# Patient Record
Sex: Female | Born: 1995 | Race: White | Hispanic: No | Marital: Single | Smoking: Never smoker
Health system: Southern US, Community
[De-identification: ages and names within clinical notes are randomized; demographics above are authoritative.]

---
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
## 2018-12-02 ENCOUNTER — Other Ambulatory Visit: Payer: Self-pay

## 2018-12-02 ENCOUNTER — Encounter (HOSPITAL_BASED_OUTPATIENT_CLINIC_OR_DEPARTMENT_OTHER): Payer: Self-pay | Admitting: *Deleted

## 2018-12-02 ENCOUNTER — Emergency Department (HOSPITAL_BASED_OUTPATIENT_CLINIC_OR_DEPARTMENT_OTHER)
Admission: EM | Admit: 2018-12-02 | Discharge: 2018-12-02 | Disposition: A | Discharge status: Home / Self Care | Payer: BC Managed Care – PPO | Attending: Medical | Admitting: Medical

## 2018-12-02 DIAGNOSIS — Z23 Encounter for immunization: Secondary | ICD-10-CM | POA: Diagnosis not present

## 2018-12-02 DIAGNOSIS — Z203 Contact with and (suspected) exposure to rabies: Secondary | ICD-10-CM | POA: Diagnosis not present

## 2018-12-02 DIAGNOSIS — Z2914 Encounter for prophylactic rabies immune globin: Secondary | ICD-10-CM | POA: Insufficient documentation

## 2018-12-02 MED ORDER — RABIES VACCINE, PCEC IM SUSR
1.0000 mL | Freq: Once | INTRAMUSCULAR | Status: AC
  Administered 2018-12-02: 1 mL via INTRAMUSCULAR
  Filled 2018-12-02: qty 1

## 2018-12-02 MED ORDER — RABIES IMMUNE GLOBULIN 150 UNIT/ML IM INJ
20.0000 [IU]/kg | INJECTION | Freq: Once | INTRAMUSCULAR | Status: AC
  Administered 2018-12-02: 23:00:00 1125 [IU] via INTRAMUSCULAR
  Filled 2018-12-02: qty 8

## 2018-12-02 NOTE — Discharge Instructions (Signed)
Please use soap and water to the affected area.  Follow-up with [facility] at [facility] for the rest of your course of rabies immunizations.

## 2018-12-02 NOTE — ED Notes (Signed)
Pt. Unsure if she has been in contact with a bat or not.

## 2018-12-02 NOTE — ED Triage Notes (Signed)
Reports that a rabid bat was in their house over the weekend. States that she noticed a 'bite' on her left hand today. States that animal control called today and let her know it was positive for rabies.

## 2018-12-02 NOTE — ED Provider Notes (Signed)
[facility] EMERGENCY DEPARTMENT Provider Note   CSN: [id number] Arrival date & time: 12/02/18  2107     History   Chief Complaint Chief Complaint  Patient presents with  . Animal Bite    HPI Alexis Villanueva is a 23 y.o. female.  23 year old female here with concern for rabies exposure.  She lives in a house where her a bat was caught and later identified as positive for rabies.  She has a possible wound on her left hand and no known trauma so there is concerned that she may been bitten by the bat.  No fevers or chills.  No wound drainage or redness.     The history is provided by the patient.  Animal Bite Contact animal:  Bat Location:  Hand Hand injury location:  Dorsum of L hand Pain details:    Severity:  No pain   Progression:  Unchanged Incident location:  Home Notifications:  Animal control Relieved by:  None tried Worsened by:  Nothing Ineffective treatments:  None tried Associated symptoms: no fever, no numbness, no rash and no swelling     History reviewed. No pertinent past medical history.  There are no active problems to display for this patient.   History reviewed. No pertinent surgical history.   OB History   No obstetric history on file.      Home Medications    Prior to Admission medications   Medication Sig Start Date End Date Taking? Authorizing Provider  naproxen sodium (ANAPROX) 220 MG tablet Take 220 mg by mouth 2 (two) times daily as needed. For pain    [provider]    Family History History reviewed. No pertinent family history.  Social History Social History   Tobacco Use  . Smoking status: Never Smoker  . Smokeless tobacco: Never Used  Substance Use Topics  . Alcohol use: Never    Frequency: Never  . Drug use: Never     Allergies   Patient has no known allergies.   Review of Systems Review of Systems  Constitutional: Negative for fever.  Skin: Positive for wound (??). Negative for rash.   Neurological: Negative for numbness.     Physical Exam Updated Vital Signs BP (!) 135/91 (BP Location: Left Arm)   Pulse 100   Temp 99.4 F (37.4 C) (Oral)   Resp 18   Ht 5\' 4"  (1.626 m)   Wt 56.7 kg   LMP 11/18/2018   SpO2 100%   BMI 21.46 kg/m   Physical Exam Constitutional:      Appearance: She is well-developed.  HENT:     Head: Normocephalic and atraumatic.  Eyes:     Conjunctiva/sclera: Conjunctivae normal.  Neck:     Musculoskeletal: Neck supple.  Musculoskeletal:     Comments: She has a little discoloration over the dorsum of her left hand.  No obvious puncture wounds noted.  No overlying erythema.  Skin:    General: Skin is warm and dry.     Capillary Refill: Capillary refill takes less than 2 seconds.  Neurological:     General: No focal deficit present.     Mental Status: She is alert.     GCS: GCS eye subscore is 4. GCS verbal subscore is 5. GCS motor subscore is 6.      ED Treatments / Results  Labs (all labs ordered are listed, but only abnormal results are displayed) Labs Reviewed - No data to display  EKG None  Radiology No  results found.  Procedures Procedures (including critical care time)  Medications Ordered in ED Medications  rabies immune globulin (HYPERAB/KEDRAB) injection 1,125 Units (1,125 Units Intramuscular Given 12/02/18 2301)  rabies vaccine (RABAVERT) injection 1 mL (1 mL Intramuscular Given 12/02/18 2259)     Initial Impression / Assessment and Plan / ED Course  I have reviewed the triage vital signs and the nursing notes.  Pertinent labs & imaging results that were available during my care of the patient were reviewed by me and considered in my medical decision making (see chart for details).  Clinical Course as of Dec 03 927  Tue Nov 17, 564  3073 23 year old female here for possible exposure to a proven rapid bat  Unclear if she had any real exposure but the downside of missing it is great.  She is very anxious  about shots and is having difficulty making decision whether she wants immunization /prophylaxis.   [MB]    Clinical Course User Index [MB] Hayden Rasmussen, MD        Final Clinical Impressions(s) / ED Diagnoses   Final diagnoses:  Need for rabies vaccination    ED Discharge Orders    None       Hayden Rasmussen, MD 12/03/18 [phone]

## 2018-12-05 ENCOUNTER — Ambulatory Visit (HOSPITAL_COMMUNITY)
Admission: EM | Admit: 2018-12-05 | Discharge: 2018-12-05 | Disposition: A | Discharge status: Home / Self Care | Payer: BC Managed Care – PPO | Attending: Internal Medicine | Admitting: Internal Medicine

## 2018-12-05 ENCOUNTER — Other Ambulatory Visit: Payer: Self-pay

## 2018-12-05 DIAGNOSIS — Z203 Contact with and (suspected) exposure to rabies: Secondary | ICD-10-CM | POA: Diagnosis not present

## 2018-12-05 DIAGNOSIS — Z23 Encounter for immunization: Secondary | ICD-10-CM | POA: Diagnosis not present

## 2018-12-05 MED ORDER — RABIES VACCINE, PCEC IM SUSR
INTRAMUSCULAR | Status: AC
  Filled 2018-12-05: qty 1

## 2018-12-05 MED ORDER — RABIES VACCINE, PCEC IM SUSR
1.0000 mL | Freq: Once | INTRAMUSCULAR | Status: AC
  Administered 2018-12-05: 19:00:00 1 mL via INTRAMUSCULAR

## 2018-12-05 NOTE — ED Triage Notes (Signed)
Pt presents for second rabies injection  

## 2018-12-09 ENCOUNTER — Ambulatory Visit (HOSPITAL_COMMUNITY)
Admission: EM | Admit: 2018-12-09 | Discharge: 2018-12-09 | Disposition: A | Discharge status: Home / Self Care | Payer: BC Managed Care – PPO | Attending: Family Medicine | Admitting: Family Medicine

## 2018-12-09 ENCOUNTER — Other Ambulatory Visit: Payer: Self-pay

## 2018-12-09 DIAGNOSIS — Z203 Contact with and (suspected) exposure to rabies: Secondary | ICD-10-CM | POA: Diagnosis not present

## 2018-12-09 DIAGNOSIS — Z23 Encounter for immunization: Secondary | ICD-10-CM | POA: Diagnosis not present

## 2018-12-09 MED ORDER — RABIES VACCINE, PCEC IM SUSR
1.0000 mL | Freq: Once | INTRAMUSCULAR | Status: AC
  Administered 2018-12-09: 10:00:00 1 mL via INTRAMUSCULAR

## 2018-12-09 MED ORDER — RABIES VACCINE, PCEC IM SUSR
INTRAMUSCULAR | Status: AC
  Filled 2018-12-09: qty 1

## 2018-12-09 NOTE — ED Triage Notes (Signed)
Pt presents for 3rd rabies vaccination.

## 2018-12-16 ENCOUNTER — Ambulatory Visit (HOSPITAL_COMMUNITY)
Admission: EM | Admit: 2018-12-16 | Discharge: 2018-12-16 | Disposition: A | Discharge status: Home / Self Care | Payer: BC Managed Care – PPO | Attending: Family Medicine | Admitting: Family Medicine

## 2018-12-16 MED ORDER — RABIES VACCINE, PCEC IM SUSR
INTRAMUSCULAR | Status: AC
  Filled 2018-12-16: qty 1

## 2018-12-16 MED ORDER — RABIES VACCINE, PCEC IM SUSR
1.0000 mL | Freq: Once | INTRAMUSCULAR | Status: AC
  Administered 2018-12-16: 09:00:00 1 mL via INTRAMUSCULAR

## 2018-12-16 NOTE — ED Triage Notes (Signed)
Pt is here for her last rabies injection  °

## 2019-11-22 ENCOUNTER — Other Ambulatory Visit: Payer: Self-pay

## 2019-11-22 ENCOUNTER — Encounter (HOSPITAL_BASED_OUTPATIENT_CLINIC_OR_DEPARTMENT_OTHER): Payer: Self-pay | Admitting: Emergency Medicine

## 2019-11-22 ENCOUNTER — Emergency Department (HOSPITAL_BASED_OUTPATIENT_CLINIC_OR_DEPARTMENT_OTHER)
Admission: EM | Admit: 2019-11-22 | Discharge: 2019-11-22 | Disposition: A | Discharge status: Home / Self Care | Payer: BC Managed Care – PPO | Attending: Emergency Medicine | Admitting: Emergency Medicine

## 2019-11-22 DIAGNOSIS — S0181XA Laceration without foreign body of other part of head, initial encounter: Secondary | ICD-10-CM | POA: Insufficient documentation

## 2019-11-22 DIAGNOSIS — S0083XA Contusion of other part of head, initial encounter: Secondary | ICD-10-CM

## 2019-11-22 DIAGNOSIS — S0990XA Unspecified injury of head, initial encounter: Secondary | ICD-10-CM | POA: Diagnosis present

## 2019-11-22 DIAGNOSIS — S01112A Laceration without foreign body of left eyelid and periocular area, initial encounter: Secondary | ICD-10-CM | POA: Insufficient documentation

## 2019-11-22 MED ORDER — LIDOCAINE HCL (PF) 1 % IJ SOLN
INTRAMUSCULAR | Status: AC
  Filled 2019-11-22: qty 5

## 2019-11-22 NOTE — ED Notes (Signed)
EDP at bedside  
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
## 2020-12-27 ENCOUNTER — Emergency Department (HOSPITAL_COMMUNITY)
Admission: EM | Admit: 2020-12-27 | Discharge: 2020-12-27 | Disposition: A | Discharge status: Home / Self Care | Payer: BC Managed Care – PPO | Attending: Emergency Medicine | Admitting: Emergency Medicine

## 2020-12-27 ENCOUNTER — Emergency Department (HOSPITAL_COMMUNITY): Payer: BC Managed Care – PPO

## 2020-12-27 ENCOUNTER — Encounter (HOSPITAL_COMMUNITY): Payer: Self-pay

## 2020-12-27 ENCOUNTER — Other Ambulatory Visit: Payer: Self-pay

## 2020-12-27 DIAGNOSIS — N3000 Acute cystitis without hematuria: Secondary | ICD-10-CM | POA: Diagnosis not present

## 2020-12-27 DIAGNOSIS — M549 Dorsalgia, unspecified: Secondary | ICD-10-CM | POA: Diagnosis not present

## 2020-12-27 DIAGNOSIS — R1031 Right lower quadrant pain: Secondary | ICD-10-CM | POA: Diagnosis present

## 2020-12-27 LAB — CBC
HCT: 38.1 % (ref 36.0–46.0)
Hemoglobin: 12.8 g/dL (ref 12.0–15.0)
MCH: 31.2 pg (ref 26.0–34.0)
MCHC: 33.6 g/dL (ref 30.0–36.0)
MCV: 92.9 fL (ref 80.0–100.0)
Platelets: 216 10*3/uL (ref 150–400)
RBC: 4.1 MIL/uL (ref 3.87–5.11)
RDW: 12.1 % (ref 11.5–15.5)
WBC: 8.1 10*3/uL (ref 4.0–10.5)
nRBC: 0 % (ref 0.0–0.2)

## 2020-12-27 LAB — COMPREHENSIVE METABOLIC PANEL
ALT: 11 U/L (ref 0–44)
AST: 17 U/L (ref 15–41)
Albumin: 4 g/dL (ref 3.5–5.0)
Alkaline Phosphatase: 44 U/L (ref 38–126)
Anion gap: 10 (ref 5–15)
BUN: 13 mg/dL (ref 6–20)
CO2: 23 mmol/L (ref 22–32)
Calcium: 8.9 mg/dL (ref 8.9–10.3)
Chloride: 105 mmol/L (ref 98–111)
Creatinine, Ser: 0.66 mg/dL (ref 0.44–1.00)
GFR, Estimated: >60 mL/min (ref >60)
Glucose, Bld: 77 mg/dL (ref 70–99)
Potassium: 4 mmol/L (ref 3.5–5.1)
Sodium: 138 mmol/L (ref 135–145)
Total Bilirubin: 0.8 mg/dL (ref 0.3–1.2)
Total Protein: 7.1 g/dL (ref 6.5–8.1)

## 2020-12-27 LAB — URINALYSIS, ROUTINE W REFLEX MICROSCOPIC
Bilirubin Urine: NEGATIVE
Glucose, UA: NEGATIVE mg/dL
Hgb urine dipstick: NEGATIVE
Ketones, ur: NEGATIVE mg/dL
Nitrite: NEGATIVE
Protein, ur: NEGATIVE mg/dL
Specific Gravity, Urine: 1.01 (ref 1.005–1.030)
pH: 6 (ref 5.0–8.0)

## 2020-12-27 LAB — I-STAT BETA HCG BLOOD, ED (MC, WL, AP ONLY): I-stat hCG, quantitative: <5 m[IU]/mL (ref <5)

## 2020-12-27 LAB — LIPASE, BLOOD: Lipase: 30 U/L (ref 11–51)

## 2020-12-27 MED ORDER — CEPHALEXIN 500 MG PO CAPS: 500.0000 mg | ORAL_CAPSULE | Freq: Four times a day (QID) | ORAL | 0 refills | Status: AC

## 2020-12-27 MED ORDER — IOHEXOL 350 MG/ML SOLN
80.0000 mL | Freq: Once | INTRAVENOUS | Status: AC | PRN
  Administered 2020-12-27: 80 mL via INTRAVENOUS

## 2020-12-27 MED ORDER — IBUPROFEN 800 MG PO TABS: 800.0000 mg | ORAL_TABLET | Freq: Three times a day (TID) | ORAL | 0 refills | Status: AC | PRN

## 2020-12-27 NOTE — Discharge Instructions (Signed)
Drink plenty of fluids.  Follow-up with either your family doctor or your GYN doctor in a week to see how the infection is going.  Return sooner if any problem

## 2020-12-27 NOTE — ED Triage Notes (Signed)
Patient c/o RLQ abdominal pain x 2 days and today began having right lower back pain that started today . Patient denies any N/v/D.  Patient states she went to [facility] today and had an US done which was negative for an ovarian cyst.

## 2020-12-27 NOTE — ED Provider Notes (Signed)
[facility]-EMERGENCY DEPT Provider Note   CSN: [id number] Arrival date & time: 12/27/20  1020     History Chief Complaint  Patient presents with   Abdominal Pain   Back Pain    Alexis Villanueva is a 25 y.o. female.  Patient complains of right flank pain going to right lower quadrant.  Patient was seen by GYN and they did an ultrasound which was negative for any cysts or pregnancies.  She also had a negative pregnancy test  The history is provided by the patient and medical records. No language interpreter was used.  Abdominal Pain Pain location:  RLQ Pain quality: aching   Pain radiates to:  Does not radiate Pain severity:  Moderate Onset quality:  Sudden Timing:  Constant Associated symptoms: no chest pain, no cough, no diarrhea, no fatigue and no hematuria   Back Pain Associated symptoms: abdominal pain   Associated symptoms: no chest pain and no headaches       History reviewed. No pertinent past medical history.  There are no problems to display for this patient.   History reviewed. No pertinent surgical history.   OB History   No obstetric history on file.     Family History  Problem Relation Age of Onset   Healthy Mother    Healthy Father     Social History   Tobacco Use   Smoking status: Never   Smokeless tobacco: Never  Vaping Use   Vaping Use: Never used  Substance Use Topics   Alcohol use: Yes   Drug use: Never    Home Medications Prior to Admission medications   Medication Sig Start Date End Date Taking? Authorizing Provider  cephALEXin (KEFLEX) 500 MG capsule Take 1 capsule (500 mg total) by mouth 4 (four) times daily. 12/27/20  Yes Bethann Berkshire, MD  ibuprofen (ADVIL) 800 MG tablet Take 1 tablet (800 mg total) by mouth every 8 (eight) hours as needed for moderate pain. 12/27/20  Yes Bethann Berkshire, MD  naproxen sodium (ANAPROX) 220 MG tablet Take 220 mg by mouth 2 (two) times daily as needed. For pain   Yes  [provider]  NORTREL 0.5/35, 28, 0.5-35 MG-MCG tablet Take 1 tablet by mouth daily. 12/23/20  Yes [provider]  valACYclovir (VALTREX) 1000 MG tablet Take 1,000 mg by mouth 2 (two) times daily as needed (herpes flare ups).   Yes [provider]    Allergies    Patient has no known allergies.  Review of Systems   Review of Systems  Constitutional:  Negative for appetite change and fatigue.  HENT:  Negative for congestion, ear discharge and sinus pressure.   Eyes:  Negative for discharge.  Respiratory:  Negative for cough.   Cardiovascular:  Negative for chest pain.  Gastrointestinal:  Positive for abdominal pain. Negative for diarrhea.  Genitourinary:  Negative for frequency and hematuria.  Musculoskeletal:  Positive for back pain.  Skin:  Negative for rash.  Neurological:  Negative for seizures and headaches.  Psychiatric/Behavioral:  Negative for hallucinations.    Physical Exam Updated Vital Signs BP 111/68    Pulse 61    Temp 97.7 F (36.5 C) (Oral)    Resp 18    Ht 5\' 4"  (1.626 m)    Wt 58.1 kg    LMP 12/21/2020 Comment: neg preg test.   SpO2 98%    BMI 21.97 kg/m   Physical Exam Vitals and nursing note reviewed.  Constitutional:  Appearance: She is well-developed.  HENT:     Head: Normocephalic.     Mouth/Throat:     Mouth: Mucous membranes are moist.  Eyes:     General: No scleral icterus.    Conjunctiva/sclera: Conjunctivae normal.  Neck:     Thyroid: No thyromegaly.  Cardiovascular:     Rate and Rhythm: Normal rate and regular rhythm.     Heart sounds: No murmur heard.   No friction rub. No gallop.  Pulmonary:     Breath sounds: No stridor. No wheezing or rales.  Chest:     Chest wall: No tenderness.  Abdominal:     General: There is no distension.     Tenderness: There is abdominal tenderness. There is no rebound.  Musculoskeletal:        General: Normal range of motion.     Cervical back: Neck supple.   Lymphadenopathy:     Cervical: No cervical adenopathy.  Skin:    Findings: No erythema or rash.  Neurological:     Mental Status: She is alert and oriented to person, place, and time.     Motor: No abnormal muscle tone.     Coordination: Coordination normal.  Psychiatric:        Behavior: Behavior normal.    ED Results / Procedures / Treatments   Labs (all labs ordered are listed, but only abnormal results are displayed) Labs Reviewed  URINALYSIS, ROUTINE W REFLEX MICROSCOPIC - Abnormal; Notable for the following components:      Result Value   APPearance HAZY (*)    Leukocytes,Ua MODERATE (*)    Bacteria, UA MANY (*)    All other components within normal limits  URINE CULTURE  LIPASE, BLOOD  COMPREHENSIVE METABOLIC PANEL  CBC  I-STAT BETA HCG BLOOD, ED ([facility], [facility], [facility] ONLY)    EKG None  Radiology CT ABDOMEN PELVIS W CONTRAST  Result Date: 12/27/2020 CLINICAL DATA:  Acute abdominal pain, worse in the right flank and right lower quadrant since Sunday. EXAM: CT ABDOMEN AND PELVIS WITH CONTRAST TECHNIQUE: Multidetector CT imaging of the abdomen and pelvis was performed using the standard protocol following bolus administration of intravenous contrast. CONTRAST:  16mL OMNIPAQUE IOHEXOL 350 MG/ML SOLN COMPARISON:  None. FINDINGS: Lower chest: No acute abnormality. Hepatobiliary: No focal liver abnormality is seen. No gallstones, gallbladder wall thickening, or biliary dilatation. Pancreas: Diffusely prominent pancreas but otherwise unremarkable. No pancreatic ductal dilatation or surrounding inflammatory changes. Spleen: Normal in size without focal abnormality. Adrenals/Urinary Tract: Adrenal glands are unremarkable. Kidneys are normal, without renal calculi, focal lesion, or hydronephrosis. Bladder is unremarkable. Stomach/Bowel: Negative for bowel obstruction, significant dilatation, ileus, or free air. Appendix not visualized. No acute inflammatory process in the right lower  quadrant. No free fluid, fluid collection, hemorrhage, hematoma, abscess or significant ascites. Vascular/Lymphatic: No significant vascular findings are present. No enlarged abdominal or pelvic lymph nodes. Reproductive: Retroverted uterus. Uterus and adnexa normal in size. Trace pelvic free fluid on the right, likely physiologic. Other: No abdominal wall hernia or abnormality. No abdominopelvic ascites. Musculoskeletal: No acute or significant osseous findings. IMPRESSION: No acute abdominal or pelvic finding by CT. Appendix not visualized but no acute inflammatory process in the right lower quadrant. Trace pelvic free fluid likely physiologic Electronically Signed   By: Jerilynn Mages.  Shick M.D.   On: 12/27/2020 13:35    Procedures Procedures   Medications Ordered in ED Medications  iohexol (OMNIPAQUE) 350 MG/ML injection 80 mL (80 mLs Intravenous Contrast Given 12/27/20 1310)  ED Course  I have reviewed the triage vital signs and the nursing notes.  Pertinent labs & imaging results that were available during my care of the patient were reviewed by me and considered in my medical decision making (see chart for details).    MDM Rules/Calculators/A&P                           Patient with abdominal pain and probable urinary tract infection.  Patient will be started on Keflex and will follow back up with her GYN doctor or your family practice doctor Final Clinical Impression(s) / ED Diagnoses Final diagnoses:  Acute cystitis without hematuria    Rx / DC Orders ED Discharge Orders          Ordered    cephALEXin (KEFLEX) 500 MG capsule  4 times daily        12/27/20 1507    ibuprofen (ADVIL) 800 MG tablet  Every 8 hours PRN        12/27/20 1507             Milton Ferguson, MD 12/27/20 1513

## 2020-12-29 LAB — URINE CULTURE: Culture: NO GROWTH

## 2022-08-07 IMAGING — CT CT ABD-PELV W/ CM
2 of 4 series · 17 of 46 positions shown, 19 images · IV contrast (omnipaque)
Comparison: None.

CLINICAL DATA: Acute abdominal pain, worse in the right flank and
right lower quadrant since [REDACTED].

EXAM:
CT ABDOMEN AND PELVIS WITH CONTRAST
TECHNIQUE: Multidetector CT imaging of the abdomen and pelvis was performed
using the standard protocol following bolus administration of
intravenous contrast.
CONTRAST:  80mL OMNIPAQUE IOHEXOL 350 MG/ML SOLN

[Series 2: axial st · axial · 0.64mm/px · z∈[-596,-221]mm · 14 of 87 slices shown, 16 images]
[im 6/87  soft-tissue]
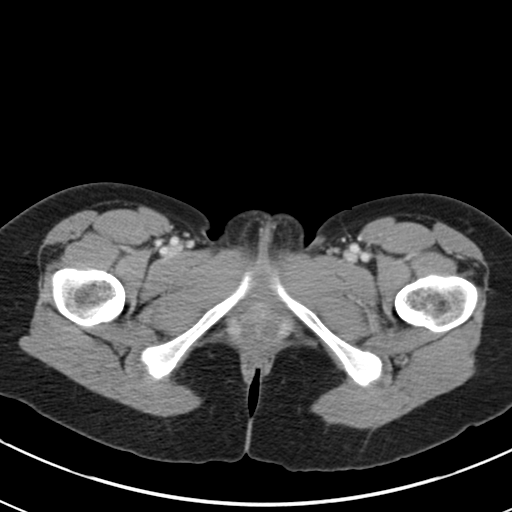
[im 6/87  bone]
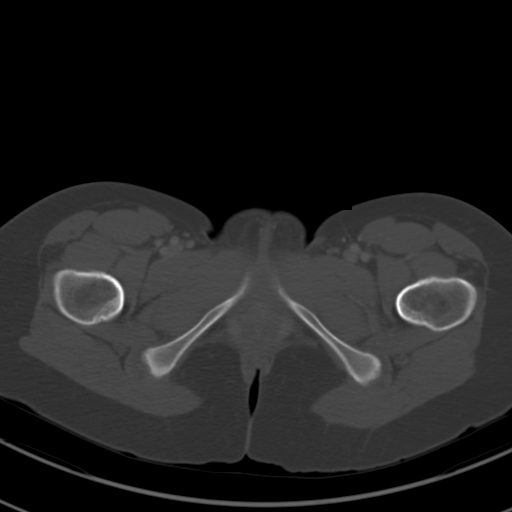
[im 11/87  soft-tissue]
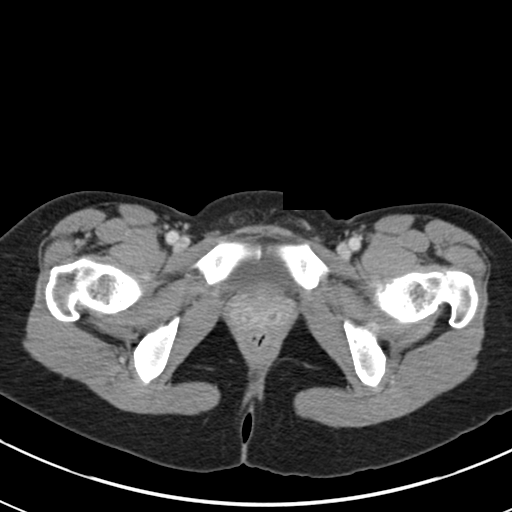
[im 16/87  soft-tissue]
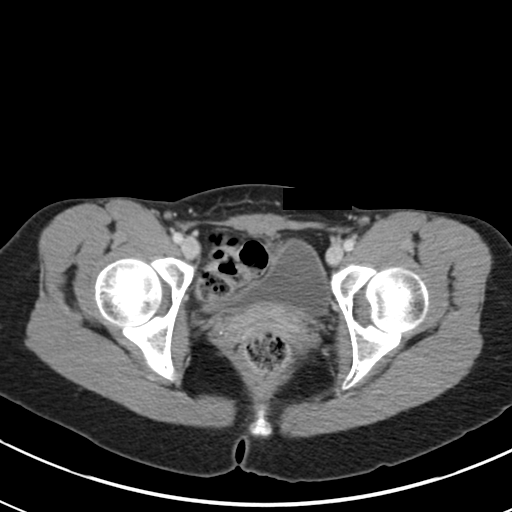
[im 26/87  soft-tissue]
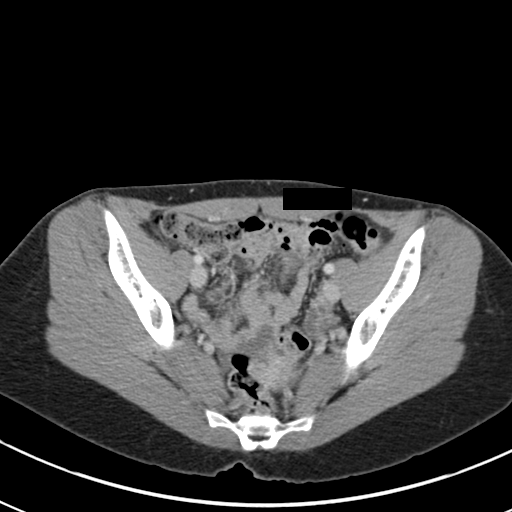
[im 31/87  soft-tissue]
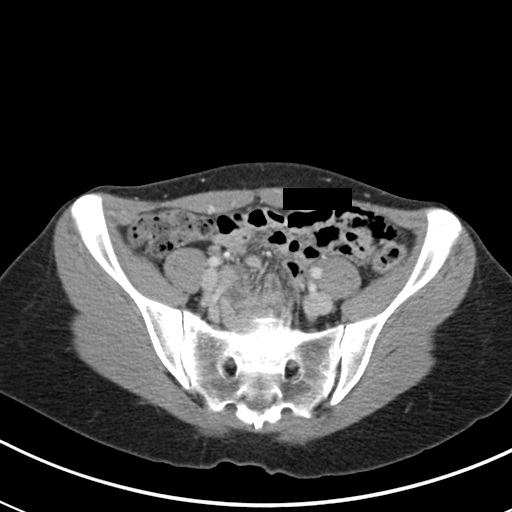
[im 36/87  soft-tissue]
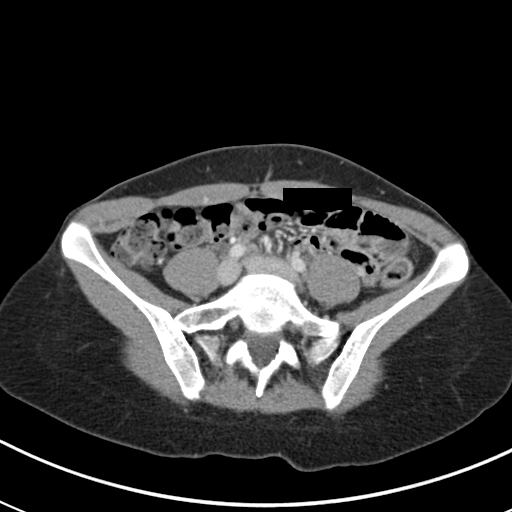
[im 41/87  soft-tissue]
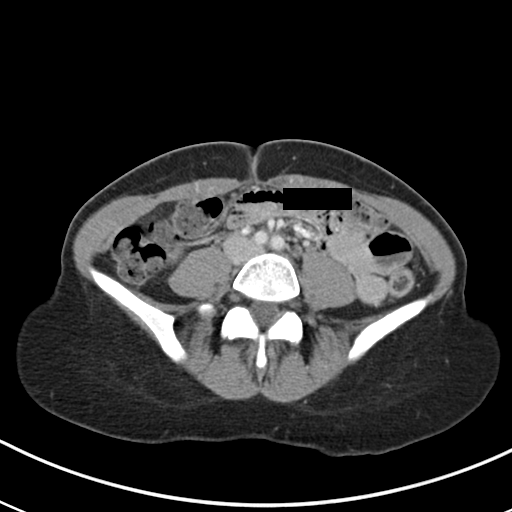
[im 46/87  soft-tissue]
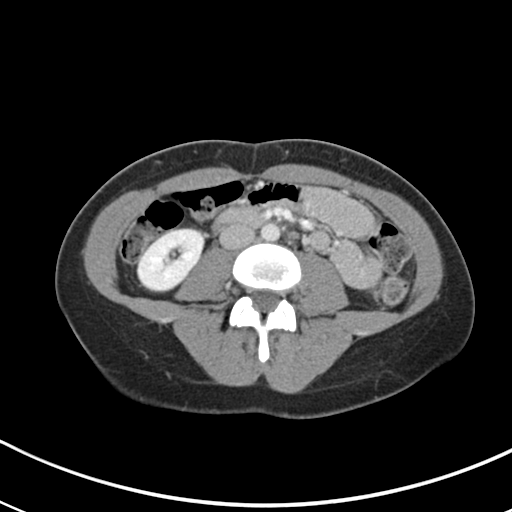
[im 51/87  soft-tissue]
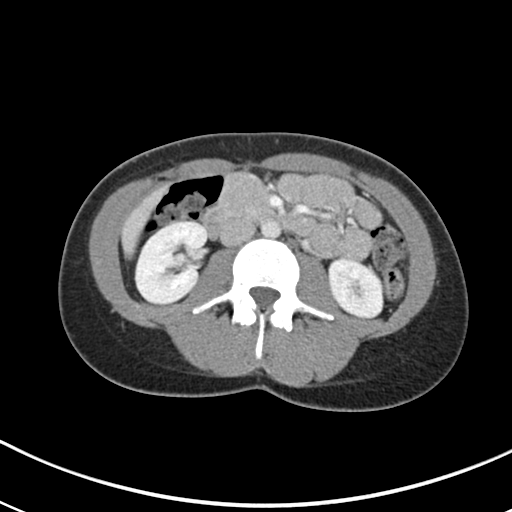
[im 51/87  bone]
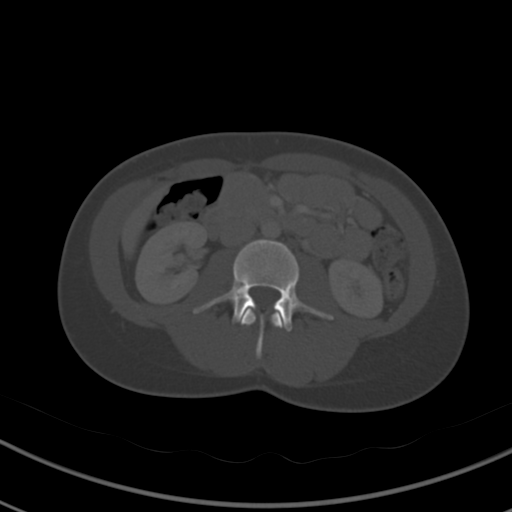
[im 56/87  soft-tissue]
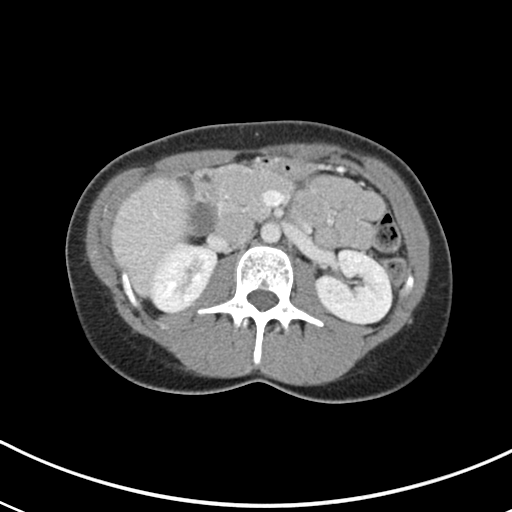
[im 66/87  soft-tissue]
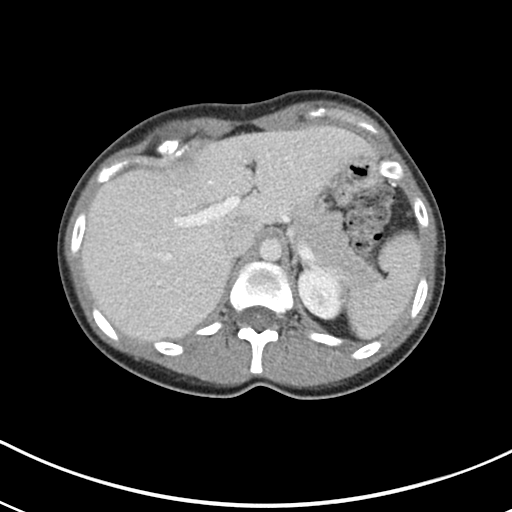
[im 71/87  soft-tissue]
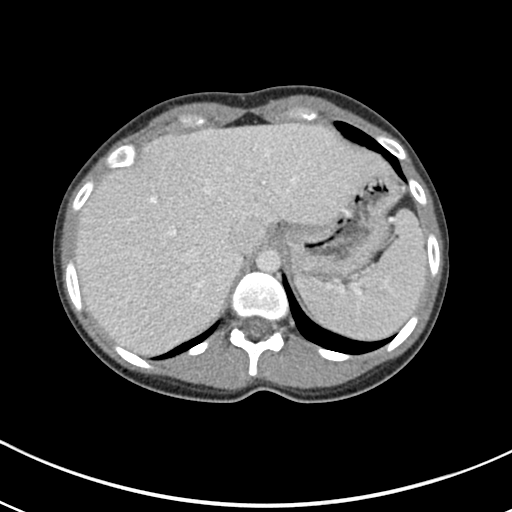
[im 76/87  soft-tissue]
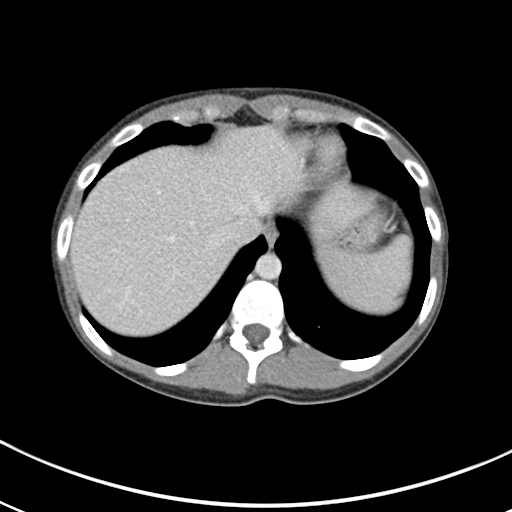
[im 81/87  soft-tissue]
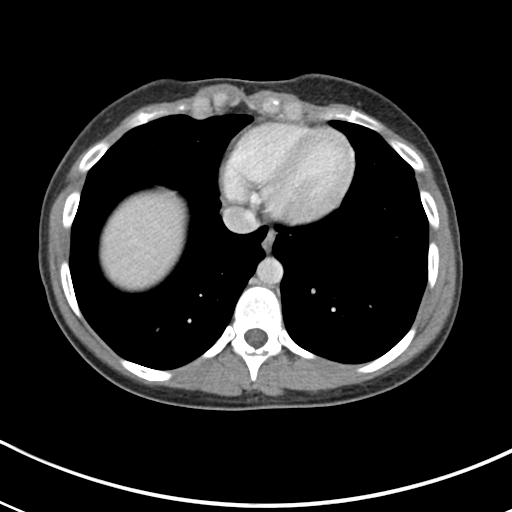

[Series 5: coronal st · coronal · 0.82mm/px · 3 of 124 slices shown]
[im 42/124  soft-tissue]
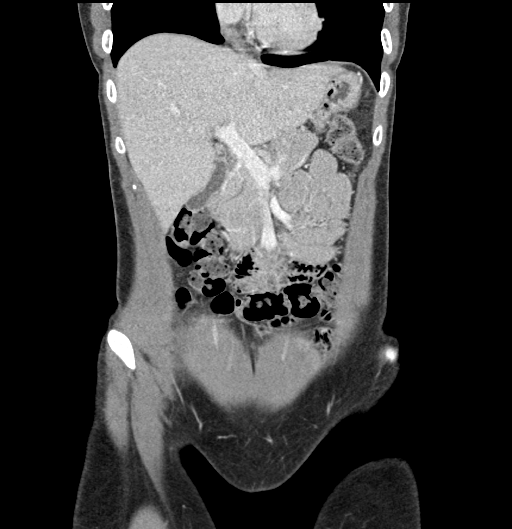
[im 55/124  soft-tissue]
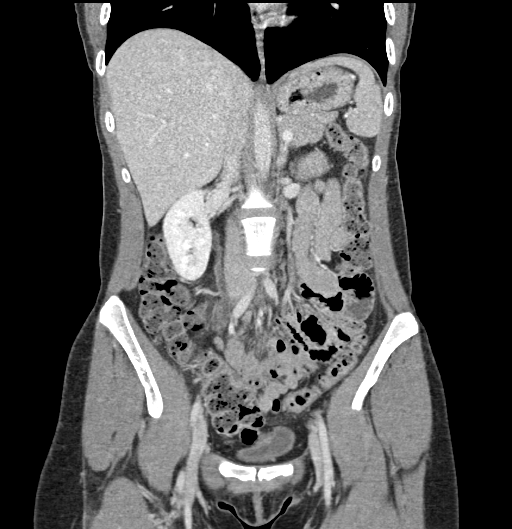
[im 69/124  soft-tissue]
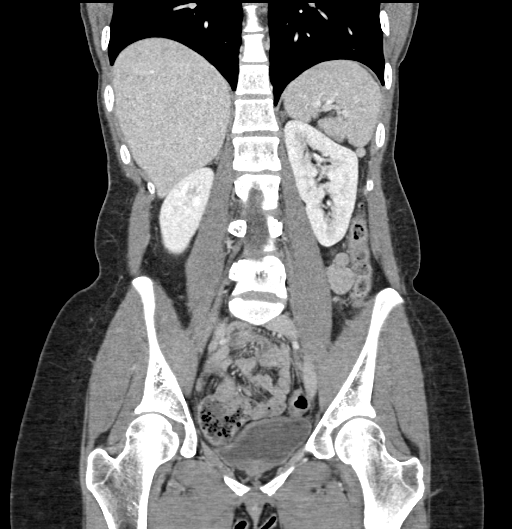

[17 of 46 positions shown; findings below may reference images not displayed]

FINDINGS: Lower chest: No acute abnormality.

Hepatobiliary: No focal liver abnormality is seen. No gallstones,
gallbladder wall thickening, or biliary dilatation.

Pancreas: Diffusely prominent pancreas but otherwise unremarkable.
No pancreatic ductal dilatation or surrounding inflammatory changes.

Spleen: Normal in size without focal abnormality.

Adrenals/Urinary Tract: Adrenal glands are unremarkable. Kidneys are
normal, without renal calculi, focal lesion, or hydronephrosis.
Bladder is unremarkable.

Stomach/Bowel: Negative for bowel obstruction, significant
dilatation, ileus, or free air. Appendix not visualized. No acute
inflammatory process in the right lower quadrant.

No free fluid, fluid collection, hemorrhage, hematoma, abscess or
significant ascites.

Vascular/Lymphatic: No significant vascular findings are present. No
enlarged abdominal or pelvic lymph nodes.

Reproductive: Retroverted uterus. Uterus and adnexa normal in size.
Trace pelvic free fluid on the right, likely physiologic.

Other: No abdominal wall hernia or abnormality. No abdominopelvic
ascites.

Musculoskeletal: No acute or significant osseous findings.
IMPRESSION: No acute abdominal or pelvic finding by CT.

Appendix not visualized but no acute inflammatory process in the
right lower quadrant.

Trace pelvic free fluid likely physiologic
